# Patient Record
Sex: Male | Born: 1975
Health system: Southern US, Community
[De-identification: ages and names within clinical notes are randomized; demographics above are authoritative.]

## PROBLEM LIST (undated history)

## (undated) HISTORY — PX: HERNIA REPAIR: SHX51

---

## 2002-02-11 HISTORY — PX: PILONIDAL CYST / SINUS EXCISION: SUR543

## 2012-03-15 ENCOUNTER — Emergency Department (HOSPITAL_COMMUNITY)
Admission: EM | Admit: 2012-03-15 | Discharge: 2012-03-15 | Disposition: A | Discharge status: Home / Self Care | Payer: Self-pay | Attending: Emergency Medicine | Admitting: Emergency Medicine

## 2012-03-15 ENCOUNTER — Encounter (HOSPITAL_COMMUNITY): Payer: Self-pay

## 2012-03-15 ENCOUNTER — Emergency Department (HOSPITAL_COMMUNITY): Payer: Self-pay

## 2012-03-15 DIAGNOSIS — R1902 Left upper quadrant abdominal swelling, mass and lump: Secondary | ICD-10-CM | POA: Insufficient documentation

## 2012-03-15 DIAGNOSIS — R0989 Other specified symptoms and signs involving the circulatory and respiratory systems: Secondary | ICD-10-CM | POA: Insufficient documentation

## 2012-03-15 DIAGNOSIS — R109 Unspecified abdominal pain: Secondary | ICD-10-CM

## 2012-03-15 DIAGNOSIS — R0609 Other forms of dyspnea: Secondary | ICD-10-CM | POA: Insufficient documentation

## 2012-03-15 DIAGNOSIS — K429 Umbilical hernia without obstruction or gangrene: Secondary | ICD-10-CM | POA: Insufficient documentation

## 2012-03-15 DIAGNOSIS — R1033 Periumbilical pain: Secondary | ICD-10-CM | POA: Insufficient documentation

## 2012-03-15 LAB — CBC WITH DIFFERENTIAL/PLATELET
Basophils Absolute: 0 10*3/uL (ref 0.0–0.1)
HCT: 43 % (ref 39.0–52.0)
Lymphocytes Relative: 26 % (ref 12–46)
Neutro Abs: 5.2 10*3/uL (ref 1.7–7.7)
Platelets: 260 10*3/uL (ref 150–400)
RDW: 12.3 % (ref 11.5–15.5)
WBC: 7.9 10*3/uL (ref 4.0–10.5)

## 2012-03-15 LAB — COMPREHENSIVE METABOLIC PANEL
ALT: 16 U/L (ref 0–53)
AST: 16 U/L (ref 0–37)
CO2: 27 mEq/L (ref 19–32)
Chloride: 98 mEq/L (ref 96–112)
GFR calc non Af Amer: >90 mL/min (ref >90)
Potassium: 4 mEq/L (ref 3.5–5.1)
Sodium: 136 mEq/L (ref 135–145)
Total Bilirubin: 0.4 mg/dL (ref 0.3–1.2)

## 2012-03-15 LAB — URINALYSIS, ROUTINE W REFLEX MICROSCOPIC
Bilirubin Urine: NEGATIVE
Hgb urine dipstick: NEGATIVE
Protein, ur: NEGATIVE mg/dL
Urobilinogen, UA: 0.2 mg/dL (ref 0.0–1.0)

## 2012-03-15 MED ORDER — IOHEXOL 300 MG/ML  SOLN
100.0000 mL | Freq: Once | INTRAMUSCULAR | Status: AC | PRN
  Administered 2012-03-15: 100 mL via INTRAVENOUS

## 2012-03-15 MED ORDER — IOHEXOL 300 MG/ML  SOLN
50.0000 mL | Freq: Once | INTRAMUSCULAR | Status: AC | PRN
  Administered 2012-03-15: 50 mL via ORAL

## 2012-03-15 MED ORDER — OXYCODONE-ACETAMINOPHEN 5-325 MG PO TABS: 2.0000 | ORAL_TABLET | ORAL | Status: DC | PRN

## 2012-03-15 NOTE — ED Notes (Signed)
Patient reports that he has had mild mid abdominal pain x 3 months and has gotten progressively worse. Patient denies N/V/D. Patient states he has increased his intake Ibuprofen intake. Patient describes the pain as "sore".

## 2012-03-15 NOTE — ED Provider Notes (Signed)
History     CSN: [id number]  Arrival date & time 03/15/12  1403   First MD Initiated Contact with Patient 03/15/12 1505      Chief Complaint  Patient presents with  . Abdominal Pain    (Consider location/radiation/quality/duration/timing/severity/associated sxs/prior treatment) HPI Comments: Patient presents to ED c/o abdominal pain x 3 months that has been gradually worsening. States pain began as a non-radiating periumbilical ache rated a 2/10 and has since progressed to a primarily LUQ pain which he describes as throbbing and non-radiating rated an 8/10. When feeling his abdomen he also noticed a "bump" in his abdomen this morning which, in combination with his pain, brought him to the ED today. Patient has tried taking ibuprofen for the pain with mild to moderate relief. Pain is made worse with movement. Admits to having difficulty "catching his breath" when the pain is at its worst. He denies recent weight change, recent travel, fever, nausea, vomiting, diarrhea, chest pain, bruising, and rashes. Patient denies having a PCP. States never a smoker and does not drink alcohol.  Patient is a 37 y.o. male presenting with abdominal pain.  Abdominal Pain The primary symptoms of the illness include abdominal pain. The primary symptoms of the illness do not include fever, shortness of breath, nausea, vomiting, diarrhea or dysuria. The current episode started more than 2 days ago.  Symptoms associated with the illness do not include chills, constipation or hematuria.    History reviewed. No pertinent past medical history.  Past Surgical History  Procedure Date  . Cystectomy     No family history on file.  History  Substance Use Topics  . Smoking status: Never Smoker   . Smokeless tobacco: Never Used  . Alcohol Use: No      Review of Systems  Constitutional: Negative for fever, chills and unexpected weight change.  Respiratory: Negative for shortness of breath and wheezing.    Cardiovascular: Negative for chest pain.  Gastrointestinal: Positive for abdominal pain. Negative for nausea, vomiting, diarrhea, constipation and blood in stool.       States he noticed a bump in his LUQ this morning  Genitourinary: Negative for dysuria and hematuria.  Skin: Negative for color change and rash.  Neurological: Negative for dizziness and light-headedness.  Hematological: Does not bruise/bleed easily.    Allergies  Review of patient's allergies indicates no known allergies.  Home Medications   Current Outpatient Rx  Name  Route  Sig  Dispense  Refill  . OMEGA-3 FATTY ACIDS 1000 MG PO CAPS   Oral   Take 2 g by mouth daily.         . IBUPROFEN 200 MG PO TABS   Oral   Take 200 mg by mouth every 6 (six) hours as needed. Pain         . ADULT MULTIVITAMIN W/MINERALS CH   Oral   Take 1 tablet by mouth daily.           BP 113/67  Pulse 74  Temp 98.7 F (37.1 C) (Oral)  Resp 16  SpO2 98%  Physical Exam  Constitutional: He appears well-developed and well-nourished. No distress.  HENT:  Head: Normocephalic and atraumatic.  Eyes: Conjunctivae normal are normal. No scleral icterus.  Cardiovascular: Normal rate, regular rhythm and normal heart sounds.   Pulmonary/Chest: Breath sounds normal. No respiratory distress. He has no wheezes.  Abdominal: Soft. Bowel sounds are normal. He exhibits no distension. There is tenderness. There is no guarding.  Musculoskeletal: Normal  range of motion.  Neurological: He is alert.  Skin: No rash noted. He is not diaphoretic. No erythema. No pallor.  Psychiatric: He has a normal mood and affect. His behavior is normal.    ED Course  Procedures (including critical care time)  Labs Reviewed  URINALYSIS, ROUTINE W REFLEX MICROSCOPIC - Abnormal; Notable for the following:    APPearance CLOUDY (*)     All other components within normal limits  CBC WITH DIFFERENTIAL  COMPREHENSIVE METABOLIC PANEL  LIPASE, BLOOD   No  results found.   No diagnosis found.    MDM  Patient presents to ED for abdominal pain x 3 months that patient states has been getting worse. This morning he states he noticed a "bump" in his LUQ where he has been having tenderness which brought him into the ED today. CBC, CMP, lipase, and UA ordered which were all unremarkable. Will proceed with CT abd/pelvis with contrast given duration of symptoms. Patient states he does not need anything for pain at this time.  Patient still awaiting CT scan. Have discussed patient with Teressa Lower, FNP who will continue management of patient. Signed out to [place] at end of shift.  Filed Vitals:   03/15/12 1456 03/15/12 1744  BP: 134/94 113/67  Pulse: 95 74  Temp: 98.7 F (37.1 C) 98.7 F (37.1 C)  TempSrc: Oral Oral  Resp: 18 16  SpO2: 99% 98%          Antony Madura, PA-C 03/15/12 1809

## 2012-03-15 NOTE — ED Provider Notes (Signed)
Medical screening examination/treatment/procedure(s) were performed by non-physician practitioner and as supervising physician I was immediately available for consultation/collaboration.  Gilda Crease, MD 03/15/12 [phone]

## 2012-03-15 NOTE — ED Notes (Signed)
Pt verbalized understanding of d/c instructions and need to follow up with surgeon. Pt ambulatory to d/c window

## 2012-03-15 NOTE — ED Notes (Signed)
Discussed findings with pt:pt is okay to follow up as needed for worsening symptoms:pt denies any injury:pt referred to [facility] as he would like to discuss options regarding hernia  Teressa Lower, NP 03/15/12 2010

## 2012-03-20 ENCOUNTER — Ambulatory Visit (INDEPENDENT_AMBULATORY_CARE_PROVIDER_SITE_OTHER): Payer: Self-pay | Admitting: General Surgery

## 2012-03-20 ENCOUNTER — Encounter (INDEPENDENT_AMBULATORY_CARE_PROVIDER_SITE_OTHER): Payer: Self-pay | Admitting: General Surgery

## 2012-03-20 VITALS — BP 108/70 | HR 72 | Temp 98.4°F | Resp 14 | Ht 68.0 in | Wt 168.0 lb

## 2012-03-20 DIAGNOSIS — K439 Ventral hernia without obstruction or gangrene: Secondary | ICD-10-CM

## 2012-03-20 DIAGNOSIS — K429 Umbilical hernia without obstruction or gangrene: Secondary | ICD-10-CM | POA: Insufficient documentation

## 2012-03-20 NOTE — Progress Notes (Signed)
Patient ID: Elijah Hays, male   DOB: October 21, 1975, 37 y.o.   MRN: [id number]  Chief Complaint  Patient presents with  . Umbilical Hernia    HPI Elijah Hays is a 37 y.o. male.   HPI 37 yo male referred by Antony Madura, PA-C for evaluation of a umbilical hernia. Patient states he has had some mild periumbilical discomfort since November 2013. He states that he got a little bit worse in December but gradually worsened throughout January especially after having a case of the flu. He denied any nausea or vomiting. He denies any diarrhea or constipation. 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 However he is not able to work currently. He is going to school during the daytime to get his masters in education. Last Sunday he noticed a lump in his left upper quadrant which is very tender and uncomfortable. This prompted him to go to the emergency room where he was evaluated. He had labs as well as a CT scan. The CT scan only revealed a small umbilical hernia as well as some stranding in the left mid abdomen on top of the rectus muscle. He denies any trauma to that area. He states that it has gotten better since the weekend. He denies any weight loss. He has been taking ibuprofen for the discomfort. He states that he still pretty tender and sensitive to touch around his umbilicus. He states it is interfering with his daily activities. No past medical history on file.  Past Surgical History  Procedure Date  . Pilonidal cyst / sinus excision 2004    No family history on file.  Social History History  Substance Use Topics  . Smoking status: Never Smoker   . Smokeless tobacco: Never Used  . Alcohol Use: No    No Known Allergies  Current Outpatient Prescriptions  Medication Sig Dispense Refill  . fish oil-omega-3 fatty acids 1000 MG capsule Take 2 g by mouth daily.      Marland Kitchen ibuprofen (ADVIL,MOTRIN) 200 MG tablet Take 200 mg by mouth  every 6 (six) hours as needed. Pain      . Multiple Vitamin (MULTIVITAMIN WITH MINERALS) TABS Take 1 tablet by mouth daily.        Review of Systems Review of Systems  Constitutional: Negative for fever, chills, appetite change and unexpected weight change.  HENT: Negative for congestion and trouble swallowing.   Eyes: Negative for visual disturbance.  Respiratory: Negative for cough, chest tightness and shortness of breath.   Cardiovascular: Negative for chest pain and leg swelling.       No PND, no orthopnea, no DOE  Gastrointestinal:       See HPI  Genitourinary: Negative for dysuria and hematuria.  Musculoskeletal: Negative.   Skin: Negative for rash.       Former pilonidal cyst problem  Neurological: Negative for seizures and speech difficulty.  Hematological: Does not bruise/bleed easily.  Psychiatric/Behavioral: Negative for behavioral problems and confusion.    Blood pressure 108/70, pulse 72, temperature 98.4 F (36.9 C), temperature source Temporal, resp. rate 14, height 5\' 8"  (1.727 m), weight 168 lb (76.204 kg).  Physical Exam Physical Exam  Vitals reviewed. Constitutional: He is oriented to person, place, and time. He appears well-developed and well-nourished. No distress.  HENT:  Head: Normocephalic and atraumatic.  Right Ear: External ear normal.  Left Ear: External ear normal.  Eyes: Conjunctivae normal are normal. No scleral icterus.  Neck:  Normal range of motion. Neck supple. No tracheal deviation present. No thyromegaly present.  Cardiovascular: Normal rate, regular rhythm and normal heart sounds.   Pulmonary/Chest: Effort normal and breath sounds normal. No respiratory distress. He has no wheezes.  Abdominal: Soft. Bowel sounds are normal. He exhibits no distension. There is tenderness. There is no rebound and no guarding.         Palpable 2cm bulge in epigastric, soft, nonreducible. Defect feels about 1 cm in size. Has a vague subcu-soft tissue induration  over left rectus muscle in mid left abdomen which is soft, nontender. Has mild TTP around umbilicus. Small fascial defect about 1 cm  Musculoskeletal: Normal range of motion.  Lymphadenopathy:    He has no cervical adenopathy.  Neurological: He is alert and oriented to person, place, and time.  Skin: Skin is warm and dry. No rash noted. He is not diaphoretic. No erythema. No pallor.  Psychiatric: He has a normal mood and affect. His behavior is normal. Judgment and thought content normal.    Data Reviewed ED note 03/15/12 Labs cmet, cbc normal CT abd - pelvis Technique: Multidetector CT imaging of the abdomen and pelvis was  performed following the standard protocol during bolus  administration of intravenous contrast.  Contrast: OMNIPAQUE IOHEXOL 300 MG/ML SOLN  Comparison: None.  Findings: Lung Bases: Mild dependent atelectasis.  Liver: Normal.  Spleen: Normal.  Gallbladder: Normal.  Common bile duct: Normal.  Pancreas: Normal.  Adrenal glands: Normal.  Kidneys: Normal enhancement. Both ureters appear within normal  limits.  Stomach: Stomach appears within normal limits.  Small bowel: Normal.  Colon: Normal appendix. No inflammatory changes of the colon.  Pelvic Genitourinary: Normal. Urinary bladder normal. No free  fluid. No pelvic adenopathy.  Bones: Normal.  Vasculature: Normal.  There is straightening in the left periumbilical abdomen, including  in the left rectus sheath. The appearance suggests prior surgery  and scar tissue. Clinical correlation recommended.  Tiny fat containing periumbilical ventral hernia.   IMPRESSION:  1. No acute abdominal abnormality.  2. Stranding in the left periumbilical abdominal wall,  nonspecific. Clinically correlate with surgical history.  Potentially this could be post-traumatic.  There is also a small epigastric hernia containing fat on CT but not officially read Assessment    Umbilical hernia Epigastric hernia     Plan    He does have a small umbilical hernia as well as a larger epigastric hernia. I reviewed his CT scan with him. I'm not sure as to the etiology of the inflammation or stranding in his left abdominal wall. However it appears to be getting better and probably of little clinical significance.   We discussed the etiology of epigastric and umbilical hernias. We discussed the signs and symptoms of incarceration and strangulation. The patient was given educational material. I also drew diagrams.  We discussed nonoperative and operative management. With respect to operative management, we discussed both open repair and laparoscopic repair. We discussed the pros and cons of each approach. I discussed the typical aftercare with each procedure and how each procedure differs. Since there is a large space of normal fascia between his epigastric and umbilical hernia I recommended an open approach. If we did a laparoscopic approach I think he would have more postoperative pain as well as the need for large piece of mesh to cover both defects. Moreover given his young age, I believe he would be best served by reapproximating his muscle with possible mesh underlay by an open approach  The patient has elected to have an open repair of both of his umbilical and epigastric hernias with possible mesh Through 2 incisions  We discussed the risk and benefits of surgery including but not limited to bleeding, infection, injury to surrounding structures, hernia recurrence, mesh complications, hematoma/seroma formation,  blood clot formation, urinary retention, post operative ileus, general anesthesia risk, long-term abdominal pain. We discussed that this procedure can be  uncomfortable to recover from. We discussed the importance of avoiding heavy lifting and straining for a period of 6 weeks.  Mary Sella. Andrey Campanile, MD, FACS General, Bariatric, & Minimally Invasive Surgery [facility], [place]          [facility] M 03/20/2012, 1:52 PM

## 2012-03-20 NOTE — Patient Instructions (Signed)
Hernia, Surgical Repair °A hernia occurs when an internal organ pushes out through a weak spot in the belly (abdominal) wall muscles. Hernias commonly occur in the groin and around the navel. Hernias often can be pushed back into place (reduced). Most hernias tend to get worse over time. Problems occur when abdominal contents get stuck in the opening (incarcerated hernia). The blood supply gets cut off (strangulated hernia). This is an emergency and needs surgery. Otherwise, hernia repair can be an elective procedure. This means you can schedule this at your convenience when an emergency is not present. Because complications can occur, if you decide to repair the hernia, it is best to do it soon. When it becomes an emergency procedure, there is increased risk of complications after surgery. °CAUSES  °· Heavy lifting. °· Obesity. °· Prolonged coughing. °· Straining to move your bowels. °· Hernias can also occur through a cut (incision) by a surgeonafter an abdominal operation. °HOME CARE INSTRUCTIONS °Before the repair: °· Bed rest is not required. You may continue your normal activities, but avoid heavy lifting (more than 10 pounds) or straining. Cough gently. If you are a smoker, it is best to stop. Even the best hernia repair can break down with the continual strain of coughing. °· Do not wear anything tight over your hernia. Do not try to keep it in with an outside bandage or truss. These can damage abdominal contents if they are trapped in the hernia sac. °· Eat a normal diet. Avoid constipation. Straining over long periods of time to have a bowel movement will increase hernia size. It also can breakdown repairs. If you cannot do this with diet alone, laxatives or stool softeners may be used. °PRIOR TO SURGERY, SEEK IMMEDIATE MEDICAL CARE IF: °You have problems (symptoms) of a trapped (incarcerated) hernia. Symptoms include: °· An oral temperature above 102° F (38.9° C) develops, or as your caregiver  suggests. °· Increasing abdominal pain. °· Feeling sick to your stomach(nausea) and vomiting. °· You stop passing gas or stool. °· The hernia is stuck outside the abdomen, looks discolored, feels hard, or is tender. °· You have any changes in your bowel habits or in the hernia that is unusual for you. °LET YOUR CAREGIVERS KNOW ABOUT THE FOLLOWING: °· Allergies. °· Medications taken including herbs, eye drops, over the counter medications, and creams. °· Use of steroids (by mouth or creams). °· Family or personal history of problems with anesthetics or Novocaine. °· Possibility of pregnancy, if this applies. °· Personal history of blood clots (thrombophlebitis). °· Family or personal history of bleeding or blood problems. °· Previous surgery. °· Other health problems. °BEFORE THE PROCEDURE °You should be present 1 hour prior to your procedure, or as directed by your caregiver.  °AFTER THE PROCEDURE °After surgery, you will be taken to the recovery area. A nurse will watch and check your progress there. Once you are awake, stable, and taking fluids well, you will be allowed to go home as long as there are no problems. Once home, an ice pack (wrapped in a light towel) applied to your operative site may help with discomfort. It may also keep the swelling down. Do not lift anything heavier than 10 pounds (4.55 kilograms). Take showers not baths. Do not drive while taking narcotics. Follow instructions as suggested by your caregiver.  °SEEK IMMEDIATE MEDICAL CARE IF: °After surgery: °· There is redness, swelling, or increasing pain in the wound. °· There is pus coming from the wound. °· There is   drainage from a wound lasting longer than 1 day. °· An unexplained oral temperature above 102° F (38.9° C) develops. °· You notice a foul smell coming from the wound or dressing. °· There is a breaking open of a wound (edged not staying together) after the sutures have been removed. °· You notice increasing pain in the shoulders  (shoulder strap areas). °· You develop dizzy episodes or fainting while standing. °· You develop persistent nausea or vomiting. °· You develop a rash. °· You have difficulty breathing. °· You develop any reaction or side effects to medications given. °MAKE SURE YOU:  °· Understand these instructions. °· Will watch your condition. °· Will get help right away if you are not doing well or get worse. °Document Released: 07/24/2000 Document Revised: 04/22/2011 Document Reviewed: 06/16/2007 °Exit[employer]® Patient Information ©2013 [employer]. ° °

## 2012-03-31 DIAGNOSIS — K439 Ventral hernia without obstruction or gangrene: Secondary | ICD-10-CM

## 2012-03-31 DIAGNOSIS — K429 Umbilical hernia without obstruction or gangrene: Secondary | ICD-10-CM

## 2012-04-24 ENCOUNTER — Encounter (INDEPENDENT_AMBULATORY_CARE_PROVIDER_SITE_OTHER): Payer: Self-pay | Admitting: General Surgery

## 2012-04-24 ENCOUNTER — Ambulatory Visit (INDEPENDENT_AMBULATORY_CARE_PROVIDER_SITE_OTHER): Payer: Self-pay | Admitting: General Surgery

## 2012-04-24 VITALS — BP 112/64 | HR 64 | Temp 97.5°F | Resp 16 | Ht 68.5 in | Wt 172.0 lb

## 2012-04-24 DIAGNOSIS — Z09 Encounter for follow-up examination after completed treatment for conditions other than malignant neoplasm: Secondary | ICD-10-CM

## 2012-04-24 NOTE — Progress Notes (Signed)
Subjective:     Patient ID: Elijah Hays, male   DOB: [birth year], 37 y.o.   MRN: [id number]  HPI  37 year old Hispanic male comes in for followup after undergoing open primary repair of an umbilical hernia as well as open primary repair of an epigastric hernia on February 18. He states that he did quite well after surgery. He denies any fevers or chills. He denies any nausea or vomiting. He denies any diarrhea or constipation. He denies any redness or drainage from his incisions. Review of Systems     Objective:   Physical Exam BP 112/64  Pulse 64  Temp(Src) 97.5 F (36.4 C) (Temporal)  Resp 16  Ht 5' 8.5" (1.74 m)  Wt 172 lb (78.019 kg)  BMI 25.77 kg/m2  Alert, no apparent distress Abdomen-soft, nontender, nondistended. Well-healed epigastric and infraumbilical incisions. No cellulitis, induration or fluctuance. No sign of hernia recurrence.    Assessment:     Status post open primary repair of an umbilical hernia as well as open repair of an epigastric hernia    Plan:     Overall he is doing quite well. I am very pleased with his progress. He was reminded he should not do any strenuous activities or heavy lifting for another 2 weeks. I then encouraged him to gradually ease back into full activities. Followup as needed  Mary Sella. Andrey Campanile, MD, FACS General, Bariatric, & Minimally Invasive Surgery [facility], [place]

## 2012-04-24 NOTE — Patient Instructions (Signed)
Can resume full activities in 2 weeks 

## 2014-03-10 ENCOUNTER — Emergency Department (HOSPITAL_COMMUNITY): Payer: Self-pay

## 2014-03-10 ENCOUNTER — Emergency Department (HOSPITAL_COMMUNITY): Payer: Self-pay | Admitting: Registered Nurse

## 2014-03-10 ENCOUNTER — Encounter (HOSPITAL_COMMUNITY): Payer: Self-pay | Admitting: Emergency Medicine

## 2014-03-10 ENCOUNTER — Encounter (HOSPITAL_COMMUNITY)
Admission: EM | Disposition: A | Discharge status: Home / Self Care | Payer: Self-pay | Source: Home / Self Care | Attending: Emergency Medicine

## 2014-03-10 ENCOUNTER — Ambulatory Visit (HOSPITAL_COMMUNITY)
Admission: EM | Admit: 2014-03-10 | Discharge: 2014-03-10 | DRG: 395 | Disposition: A | Discharge status: Home / Self Care | Payer: Self-pay | Attending: Emergency Medicine | Admitting: Emergency Medicine

## 2014-03-10 ENCOUNTER — Ambulatory Visit: Admit: 2014-03-10 | Payer: Self-pay | Admitting: Surgery

## 2014-03-10 DIAGNOSIS — T185XXA Foreign body in anus and rectum, initial encounter: Secondary | ICD-10-CM | POA: Diagnosis present

## 2014-03-10 HISTORY — PX: RECTAL EXAM UNDER ANESTHESIA: SHX6399

## 2014-03-10 SURGERY — EXAM UNDER ANESTHESIA, RECTUM
Anesthesia: General | Site: Rectum

## 2014-03-10 MED ORDER — LACTATED RINGERS IV SOLN
INTRAVENOUS | Status: DC | PRN
  Administered 2014-03-10: 20:00:00 via INTRAVENOUS

## 2014-03-10 MED ORDER — ONDANSETRON HCL 4 MG/2ML IJ SOLN
INTRAMUSCULAR | Status: DC | PRN
  Administered 2014-03-10: 4 mg via INTRAVENOUS

## 2014-03-10 MED ORDER — FENTANYL CITRATE 0.05 MG/ML IJ SOLN
INTRAMUSCULAR | Status: DC | PRN
  Administered 2014-03-10: 50 ug via INTRAVENOUS

## 2014-03-10 MED ORDER — PROPOFOL 10 MG/ML IV BOLUS
INTRAVENOUS | Status: AC
  Filled 2014-03-10: qty 20

## 2014-03-10 MED ORDER — 0.9 % SODIUM CHLORIDE (POUR BTL) OPTIME
TOPICAL | Status: DC | PRN
  Administered 2014-03-10: 2000 mL

## 2014-03-10 MED ORDER — GLYCOPYRROLATE 0.2 MG/ML IJ SOLN
INTRAMUSCULAR | Status: AC
  Filled 2014-03-10: qty 3

## 2014-03-10 MED ORDER — OXYCODONE HCL 5 MG/5ML PO SOLN
5.0000 mg | Freq: Once | ORAL | Status: DC | PRN
  Filled 2014-03-10: qty 5

## 2014-03-10 MED ORDER — SUCCINYLCHOLINE CHLORIDE 20 MG/ML IJ SOLN
INTRAMUSCULAR | Status: DC | PRN
  Administered 2014-03-10: 100 mg via INTRAVENOUS

## 2014-03-10 MED ORDER — KETOROLAC TROMETHAMINE 30 MG/ML IJ SOLN: 30.0000 mg | Freq: Once | INTRAMUSCULAR | Status: DC | PRN

## 2014-03-10 MED ORDER — MIDAZOLAM HCL 5 MG/5ML IJ SOLN
INTRAMUSCULAR | Status: DC | PRN
  Administered 2014-03-10: 2 mg via INTRAVENOUS

## 2014-03-10 MED ORDER — PROMETHAZINE HCL 25 MG/ML IJ SOLN: 6.2500 mg | INTRAMUSCULAR | Status: DC | PRN

## 2014-03-10 MED ORDER — FENTANYL CITRATE 0.05 MG/ML IJ SOLN: 25.0000 ug | INTRAMUSCULAR | Status: DC | PRN

## 2014-03-10 MED ORDER — ROCURONIUM BROMIDE 100 MG/10ML IV SOLN
INTRAVENOUS | Status: DC | PRN
  Administered 2014-03-10: 20 mg via INTRAVENOUS

## 2014-03-10 MED ORDER — GLYCOPYRROLATE 0.2 MG/ML IJ SOLN
INTRAMUSCULAR | Status: DC | PRN
  Administered 2014-03-10: .6 mg via INTRAVENOUS

## 2014-03-10 MED ORDER — NEOSTIGMINE METHYLSULFATE 10 MG/10ML IV SOLN
INTRAVENOUS | Status: AC
  Filled 2014-03-10: qty 1

## 2014-03-10 MED ORDER — ONDANSETRON HCL 4 MG/2ML IJ SOLN
INTRAMUSCULAR | Status: AC
  Filled 2014-03-10: qty 2

## 2014-03-10 MED ORDER — MIDAZOLAM HCL 2 MG/2ML IJ SOLN
INTRAMUSCULAR | Status: AC
  Filled 2014-03-10: qty 2

## 2014-03-10 MED ORDER — NEOSTIGMINE METHYLSULFATE 10 MG/10ML IV SOLN
INTRAVENOUS | Status: DC | PRN
  Administered 2014-03-10: 4 mg via INTRAVENOUS

## 2014-03-10 MED ORDER — FENTANYL CITRATE 0.05 MG/ML IJ SOLN
INTRAMUSCULAR | Status: AC
  Filled 2014-03-10: qty 5

## 2014-03-10 MED ORDER — PROPOFOL 10 MG/ML IV BOLUS
INTRAVENOUS | Status: DC | PRN
  Administered 2014-03-10: 200 mg via INTRAVENOUS

## 2014-03-10 MED ORDER — LIDOCAINE HCL (CARDIAC) 20 MG/ML IV SOLN
INTRAVENOUS | Status: DC | PRN
  Administered 2014-03-10: 100 mg via INTRAVENOUS

## 2014-03-10 MED ORDER — LIDOCAINE HCL (CARDIAC) 20 MG/ML IV SOLN
INTRAVENOUS | Status: AC
  Filled 2014-03-10: qty 5

## 2014-03-10 MED ORDER — OXYCODONE HCL 5 MG PO TABS: 5.0000 mg | ORAL_TABLET | Freq: Once | ORAL | Status: DC | PRN

## 2014-03-10 SURGICAL SUPPLY — 41 items
BLADE HEX COATED 2.75 (ELECTRODE) IMPLANT
BLADE SURG 15 STRL LF DISP TIS (BLADE) IMPLANT
BLADE SURG 15 STRL SS (BLADE)
CANNULA VESSEL W/WING WO/VALVE (CANNULA) IMPLANT
DECANTER SPIKE VIAL GLASS SM (MISCELLANEOUS) IMPLANT
DRAPE SHEET LG 3/4 BI-LAMINATE (DRAPES) ×3 IMPLANT
DRSG PAD ABDOMINAL 8X10 ST (GAUZE/BANDAGES/DRESSINGS) IMPLANT
ELECT REM PT RETURN 9FT ADLT (ELECTROSURGICAL) ×3
ELECTRODE REM PT RTRN 9FT ADLT (ELECTROSURGICAL) ×1 IMPLANT
GAUZE SPONGE 4X4 12PLY STRL (GAUZE/BANDAGES/DRESSINGS) ×3 IMPLANT
GAUZE SPONGE 4X4 16PLY XRAY LF (GAUZE/BANDAGES/DRESSINGS) IMPLANT
GLOVE BIOGEL PI IND STRL 7.0 (GLOVE) ×1 IMPLANT
GLOVE BIOGEL PI INDICATOR 7.0 (GLOVE) ×2
GLOVE SURG SIGNA 7.5 PF LTX (GLOVE) ×3 IMPLANT
GOWN SPEC L4 XLG W/TWL (GOWN DISPOSABLE) ×3 IMPLANT
GOWN STRL REUS W/ TWL XL LVL3 (GOWN DISPOSABLE) IMPLANT
GOWN STRL REUS W/TWL LRG LVL3 (GOWN DISPOSABLE) ×3 IMPLANT
GOWN STRL REUS W/TWL XL LVL3 (GOWN DISPOSABLE)
KIT BASIN OR (CUSTOM PROCEDURE TRAY) ×3 IMPLANT
LUBRICANT JELLY K Y 4OZ (MISCELLANEOUS) ×3 IMPLANT
NDL SAFETY ECLIPSE 18X1.5 (NEEDLE) IMPLANT
NEEDLE HYPO 18GX1.5 SHARP (NEEDLE)
NEEDLE HYPO 25X1 1.5 SAFETY (NEEDLE) IMPLANT
NS IRRIG 1000ML POUR BTL (IV SOLUTION) ×3 IMPLANT
PACK GENERAL/GYN (CUSTOM PROCEDURE TRAY) ×3 IMPLANT
PACK LITHOTOMY IV (CUSTOM PROCEDURE TRAY) IMPLANT
PENCIL BUTTON HOLSTER BLD 10FT (ELECTRODE) IMPLANT
SPONGE SURGIFOAM ABS GEL 100 (HEMOSTASIS) IMPLANT
SURGILUBE 3G PEEL PACK STRL (MISCELLANEOUS) IMPLANT
SUT CHROMIC 2 0 SH (SUTURE) IMPLANT
SUT CHROMIC 3 0 SH 27 (SUTURE) IMPLANT
SYR 50ML LL SCALE MARK (SYRINGE) IMPLANT
SYR BULB IRRIGATION 50ML (SYRINGE) IMPLANT
SYR CONTROL 10ML LL (SYRINGE) IMPLANT
TOWEL OR 17X26 10 PK STRL BLUE (TOWEL DISPOSABLE) ×3 IMPLANT
TRAP SPECIMEN MUCOUS 40CC (MISCELLANEOUS) IMPLANT
TUBING CONNECTING 10 (TUBING) IMPLANT
TUBING CONNECTING 10' (TUBING)
UNDERPAD 30X30 INCONTINENT (UNDERPADS AND DIAPERS) ×6 IMPLANT
WATER STERILE IRR 1500ML POUR (IV SOLUTION) IMPLANT
YANKAUER SUCT BULB TIP 10FT TU (MISCELLANEOUS) IMPLANT

## 2014-03-10 NOTE — ED Notes (Signed)
Pt states that he put a plastic device in his rectum several hours ago and cant get it out. Dr Thalia BloodgoodNeuman is being paged and aware pt is coming and will liely go straight to surgery.

## 2014-03-10 NOTE — Anesthesia Preprocedure Evaluation (Addendum)
Anesthesia Evaluation  Patient identified by MRN, date of birth, ID band Patient awake    Reviewed: Allergy & Precautions, NPO status , Patient's Chart, lab work & pertinent test results  Airway Mallampati: II  TM Distance: >3 FB Neck ROM: Full    Dental   Pulmonary neg pulmonary ROS,          Cardiovascular negative cardio ROS      Neuro/Psych negative neurological ROS     GI/Hepatic negative GI ROS, Neg liver ROS,   Endo/Other  negative endocrine ROS  Renal/GU negative Renal ROS     Musculoskeletal   Abdominal   Peds  Hematology negative hematology ROS (+)   Anesthesia Other Findings   Reproductive/Obstetrics                            Anesthesia Physical Anesthesia Plan  ASA: I  Anesthesia Plan: General   Post-op Pain Management:    Induction: Intravenous  Airway Management Planned: Oral ETT  Additional Equipment:   Intra-op Plan:   Post-operative Plan: Extubation in OR  Informed Consent: I have reviewed the patients History and Physical, chart, labs and discussed the procedure including the risks, benefits and alternatives for the proposed anesthesia with the patient or authorized representative who has indicated his/her understanding and acceptance.     Plan Discussed with: CRNA  Anesthesia Plan Comments:         Anesthesia Quick Evaluation

## 2014-03-10 NOTE — Op Note (Signed)
03/10/2014  8:42 PM  PATIENT:  Elijah Hays, 39 y.o., male, MRN: [id number]  PREOP DIAGNOSIS:  foreign body rectum  POSTOP DIAGNOSIS:   foreign body rectum, approximate 18 cm dildo (photo at end of note)  PROCEDURE:   Procedure(s): RECTAL EXAM UNDER ANESTHESIA, removal of foreign body from rectum  SURGEON:   Ovidio Kinavid Bravlio Luca, M.D.  ASSISTANT:   None  ANESTHESIA:   general  Anesthesiologist: Kennieth Radobert E Fitzgerald, MD CRNA: Elisabeth CaraLacey A Armistead, CRNA  General  EBL:  none  ml  LOCAL MEDICATIONS USED:   None  SPECIMEN:   Dildo - will be returned to patient  COUNTS CORRECT:  YES  INDICATIONS FOR PROCEDURE:  Elijah Hays is a 39 y.o. (DOB: 1975/07/07) white  male whose primary care physician is No primary care provider on file.Marland Kitchen.  He has inserted a dildo up his rectum.  He comes for removal of this rectum.   The indications and risks of the surgery were explained to the patient.  The risks include, but are not limited to, perforation, failure to remove object, and nerve injury.  Procedure Note:  [facility]    A time out was held and the surgical checklist run.  The patient was in lithotomy.  The patient was unprepped.  I could feel the tip of the foreign body with my index finger - about 7-9 cm up the rectum.  I was able to push the foreign body with my hand on the abdomen and I grabbed the foreign body with sponge forceps.  The foreign body was removed intact.  I had to dilate his rectum to get this out, but there was no other apparent injury.  I will discharge him home tonight with return to my office on a PRN basis.   Foreign body removed   Ovidio Kinavid Keana Dueitt, MD, [facility] Pager: [phone] Office phone:  [phone]

## 2014-03-10 NOTE — H&P (Signed)
Re:   Elijah Hays DOB:   1975/06/20 MRN:   [id number]   [facility] Admission  ASSESSMENT AND PLAN: 1.  Foreign body in rectum  Patient has dildo in rectum.  I discussed with him about the removal of this and the risks of bowel perforation and tearing rectum.  2.  History of umbilical hernia repair - 2014 - [facility]  Chief Complaint  Patient presents with  . Foreign Body in Rectum   REFERRING PHYSICIAN: No primary care provider on file.  HISTORY OF PRESENT ILLNESS: Elijah Hays is a 39 y.o. (DOB: 10/01/1975) white  male whose primary care physician is No primary care provider on file. and comes [facility] today for a foreign body in his rectum.  I was called by Daryel November, PA who works with Dr. Cleophas Dunker.  He called me about the patient loosing a dildo up his rectum.  I had him bring him to [facility]. He will need general anesthesia to remove this.  He has not prior history of rectal or colon disease.  His only abdominal surgery was an umbilical hernia by Dr. Andrey Campanile in 2004.   History reviewed. No pertinent past medical history.    Past Surgical History  Procedure Laterality Date  . Pilonidal cyst / sinus excision  2004  . Hernia repair        No current facility-administered medications for this encounter.   Current Outpatient Prescriptions  Medication Sig Dispense Refill  . Camphor-Eucalyptus-Menthol (VICKS VAPORUB EX) Apply 1 application topically daily as needed (cough ( applies to neck area)).    . Multiple Vitamins-Minerals (MULTI ADULT GUMMIES PO) Take 1 each by mouth daily.    Marland Kitchen oxymetazoline (AFRIN) 0.05 % nasal spray Place 1 spray into both nostrils daily as needed for congestion.    . pseudoephedrine-guaifenesin (MUCINEX D) 60-600 MG per tablet Take 1 tablet by mouth daily.       No Known Allergies  REVIEW OF SYSTEMS: Skin:  No history of rash.  No history of abnormal moles. Infection:  No history of hepatitis or HIV.  No history of MRSA. Neurologic:  No history  of stroke.  No history of seizure.  No history of headaches. Cardiac:  No history of hypertension. No history of heart disease.  No history of prior cardiac catheterization.  No history of seeing a cardiologist. Pulmonary:  He has had some recent sinus symptoms for which he has taken OTC meds.  Endocrine:  No diabetes. No thyroid disease. Gastrointestinal:  No history of stomach disease.  No history of liver disease.  No history of gall bladder disease.  No history of pancreas disease.  No history of colon disease. Urologic:  No history of kidney stones.  No history of bladder infections. Musculoskeletal:  No history of joint or back disease. Hematologic:  No bleeding disorder.  No history of anemia.  Not anticoagulated. Psycho-social:  The patient is oriented.   The patient has no obvious psychologic or social impairment to understanding our conversation and plan.  SOCIAL and FAMILY HISTORY: Single. Lives by self. Works as Publishing copy  PHYSICAL EXAM: BP 126/81 mmHg  Pulse 83  Temp(Src) 98.3 F (36.8 C) (Oral)  Resp 18  Ht  (1.727 m)  Wt 182 lb (82.555 kg)  BMI 27.68 kg/m2  SpO2 97%  General: WM who is alert and generally healthy appearing.  HEENT: Normal. Pupils equal. Neck: Supple. No mass.  No thyroid mass. Lymph Nodes:  No supraclavicular or cervical nodes.  Lungs: Clear to auscultation and symmetric breath sounds. Heart:  RRR. No murmur or rub. Abdomen: Soft. No mass. No tenderness. No hernia. Normal bowel sounds.   Rectal: Not done. Extremities:  Good strength and ROM  in upper and lower extremities. Neurologic:  Grossly intact to motor and sensory function. Psychiatric: Has normal mood and affect. Behavior is normal.   DATA REVIEWED: Epic notes  Ovidio Kinavid Sienna Stonehocker, MD,  [facility] [address]  [address]   [place], [place]    [id number] Phone:  [phone] FAX:  [phone]

## 2014-03-10 NOTE — Anesthesia Postprocedure Evaluation (Signed)
  Anesthesia Post-op Note  Patient: Elijah Hays  Procedure(s) Performed: Procedure(s): RECTAL EXAM UNDER ANESTHESIA, removal of foreign body from rectum (N/A)  Patient Location: PACU  Anesthesia Type:General  Level of Consciousness: awake and alert   Airway and Oxygen Therapy: Patient Spontanous Breathing  Post-op Pain: none  Post-op Assessment: Post-op Vital signs reviewed  Post-op Vital Signs: Reviewed  Last Vitals:  Filed Vitals:   03/10/14 2130  BP: 104/60  Pulse: 61  Temp: 36.9 C  Resp: 11    Complications: No apparent anesthesia complications

## 2014-03-10 NOTE — Discharge Instructions (Signed)
[facility] - DISCHARGE INSTRUCTIONS TO PATIENT  Activity:  Driving - May drive tomorrow if doing well  Diet:  As tolerated  Follow up appointment:  Call Dr. Allene PyoNewman's office [facility]) at [phone] for question.  No follow up necessary.   Call Dr. Ezzard StandingNewman or his office  [phone]) if you have:  Persistent nausea and vomiting,  Severe uncontrolled pain,  Any other questions or concerns you may have after discharge.  In an emergency, call 911 or go to an Emergency Department at a nearby hospital.    Post Anesthesia Home Care Instructions  Activity: Get plenty of rest for the remainder of the day. A responsible adult should stay with you for 24 hours following the procedure.  For the next 24 hours, DO NOT: -Drive a car -Advertising copywriterperate machinery -Drink alcoholic beverages -Take any medication unless instructed by your physician -Make any legal decisions or sign important papers.  Meals: Start with liquid foods such as gelatin or soup. Progress to regular foods as tolerated. Avoid greasy, spicy, heavy foods. If nausea and/or vomiting occur, drink only clear liquids until the nausea and/or vomiting subsides. Call your physician if vomiting continues.  Special Instructions/Symptoms: Your throat may feel dry or sore from the anesthesia or the breathing tube placed in your throat during surgery. If this causes discomfort, gargle with warm salt water. The discomfort should disappear within 24 hours.

## 2014-03-10 NOTE — Transfer of Care (Signed)
Immediate Anesthesia Transfer of Care Note  Patient: Elijah Hays  Procedure(s) Performed: Procedure(s): RECTAL EXAM UNDER ANESTHESIA, removal of foreign body from rectum (N/A)  Patient Location: PACU  Anesthesia Type:General  Level of Consciousness: awake, alert , oriented and patient cooperative  Airway & Oxygen Therapy: Patient Spontanous Breathing and Patient connected to face mask oxygen  Post-op Assessment: Report given to RN, Post -op Vital signs reviewed and stable and Patient moving all extremities  Post vital signs: Reviewed and stable  Last Vitals:  Filed Vitals:   03/10/14 1843  BP: 126/81  Pulse: 83  Temp: 36.8 C  Resp: 18    Complications: No apparent anesthesia complications

## 2014-03-10 NOTE — ED Provider Notes (Signed)
CSN: [id number]     Arrival date & time 03/10/14  1838 History   First MD Initiated Contact with Patient 03/10/14 1904     Chief Complaint  Patient presents with  . Foreign Body in Rectum     (Consider location/radiation/quality/duration/timing/severity/associated sxs/prior Treatment) HPI Comments: Pt was using a vibrator and it got stuck in his rectum  Patient is a 39 y.o. male presenting with foreign body in rectum. The history is provided by the patient.  Foreign Body in Rectum This is a new problem. The current episode started 1 to 2 hours ago. The problem occurs constantly. The problem has not changed since onset.Pertinent negatives include no abdominal pain. Nothing aggravates the symptoms. Nothing relieves the symptoms. He has tried nothing for the symptoms. The treatment provided no relief.    History reviewed. No pertinent past medical history. Past Surgical History  Procedure Laterality Date  . Pilonidal cyst / sinus excision  2004  . Hernia repair     No family history on file. History  Substance Use Topics  . Smoking status: Never Smoker   . Smokeless tobacco: Never Used  . Alcohol Use: No    Review of Systems  Gastrointestinal: Negative for abdominal pain.  All other systems reviewed and are negative.     Allergies  Review of patient's allergies indicates no known allergies.  Home Medications   Prior to Admission medications   Medication Sig Start Date End Date Taking? Authorizing Provider  Camphor-Eucalyptus-Menthol (VICKS VAPORUB EX) Apply 1 application topically daily as needed (cough ( applies to neck area)).   Yes Historical Provider, MD  Multiple Vitamins-Minerals (MULTI ADULT GUMMIES PO) Take 1 each by mouth daily.   Yes Historical Provider, MD  oxymetazoline (AFRIN) 0.05 % nasal spray Place 1 spray into both nostrils daily as needed for congestion.   Yes Historical Provider, MD  pseudoephedrine-guaifenesin (MUCINEX D) 60-600 MG per tablet Take 1  tablet by mouth daily.   Yes Historical Provider, MD   BP 126/81 mmHg  Pulse 83  Temp(Src) 98.3 F (36.8 C) (Oral)  Resp 18  Ht 5\' 8"  (1.727 m)  Wt 182 lb (82.555 kg)  BMI 27.68 kg/m2  SpO2 97% Physical Exam  Constitutional: He is oriented to person, place, and time. He appears well-developed and well-nourished. No distress.  HENT:  Head: Normocephalic and atraumatic.  Eyes: EOM are normal. Pupils are equal, round, and reactive to light.  Cardiovascular: Normal rate.   Pulmonary/Chest: Effort normal.  Abdominal: Soft. Bowel sounds are normal. He exhibits no distension. There is no tenderness. There is no rebound and no guarding.  Genitourinary: Rectum normal.  Musculoskeletal: Normal range of motion. He exhibits no edema or tenderness.  Neurological: He is alert and oriented to person, place, and time.  Skin: Skin is warm and dry.  Psychiatric: He has a normal mood and affect. His behavior is normal.  Nursing note and vitals reviewed.   ED Course  Procedures (including critical care time) Labs Review Labs Reviewed - No data to display  Imaging Review Dg Abd 1 View  03/10/2014   CLINICAL DATA:  Foreign body (dildo) placed in rectum, unable to retrieve. Initial encounter.  EXAM: ABDOMEN - 1 VIEW  COMPARISON:  Abdominal pelvic CT 03/15/2012.  FINDINGS: Metallic foreign bodies overlie the lower lumbar spine and sacrum, consistent with a foreign body within the rectosigmoid colon. The radiopaque components of this measure up to 12.9 cm in length. There is moderate stool throughout the colon. No  bowel wall thickening or supine evidence of free intraperitoneal air identified. The bones appear normal.  IMPRESSION: Presacral foreign body consistent with position in the rectosigmoid colon. If this cannot be retrieved manually or if there is concern of perforation, further evaluation with CT should be considered.   Electronically Signed   By: Roxy Horseman M.D.   On: 03/10/2014 19:22     EKG  Interpretation None      MDM   Final diagnoses:  Foreign body of rectum, initial encounter    Patient here with a retained vibrator in his rectum. He is currently having no abdominal pain, nausea or vomiting. It is present for approximately 2 hours. Dr. Ezzard Standing with surgery contacted for further treatment    Gwyneth Sprout, MD 03/10/14 1931

## 2014-03-10 NOTE — Anesthesia Procedure Notes (Signed)
Procedure Name: Intubation Date/Time: 03/10/2014 8:16 PM Performed by: Jarvis NewcomerARMISTEAD, Cephas Revard A Pre-anesthesia Checklist: Patient identified, Timeout performed, Emergency Drugs available, Suction available and Patient being monitored Patient Re-evaluated:Patient Re-evaluated prior to inductionOxygen Delivery Method: Circle system utilized Preoxygenation: Pre-oxygenation with 100% oxygen Intubation Type: IV induction and Rapid sequence Laryngoscope Size: Mac and 4 Grade View: Grade I Tube type: Oral Tube size: 7.5 mm Number of attempts: 1 Airway Equipment and Method: Stylet Placement Confirmation: ETT inserted through vocal cords under direct vision,  breath sounds checked- equal and bilateral and positive ETCO2 Secured at: 22 cm Tube secured with: Tape Dental Injury: Teeth and Oropharynx as per pre-operative assessment

## 2014-03-11 ENCOUNTER — Encounter (HOSPITAL_COMMUNITY): Payer: Self-pay | Admitting: Surgery

## 2016-04-13 IMAGING — CR DG ABDOMEN 1V
2 series · 2 of 2 positions shown · non-contrast
Comparison: Abdominal pelvic CT 03/15/2012.

CLINICAL DATA: Foreign body (dildo) placed in rectum, unable to
retrieve. Initial encounter.

EXAM:
ABDOMEN - 1 VIEW

[t abdomen supine (1 of 2)]
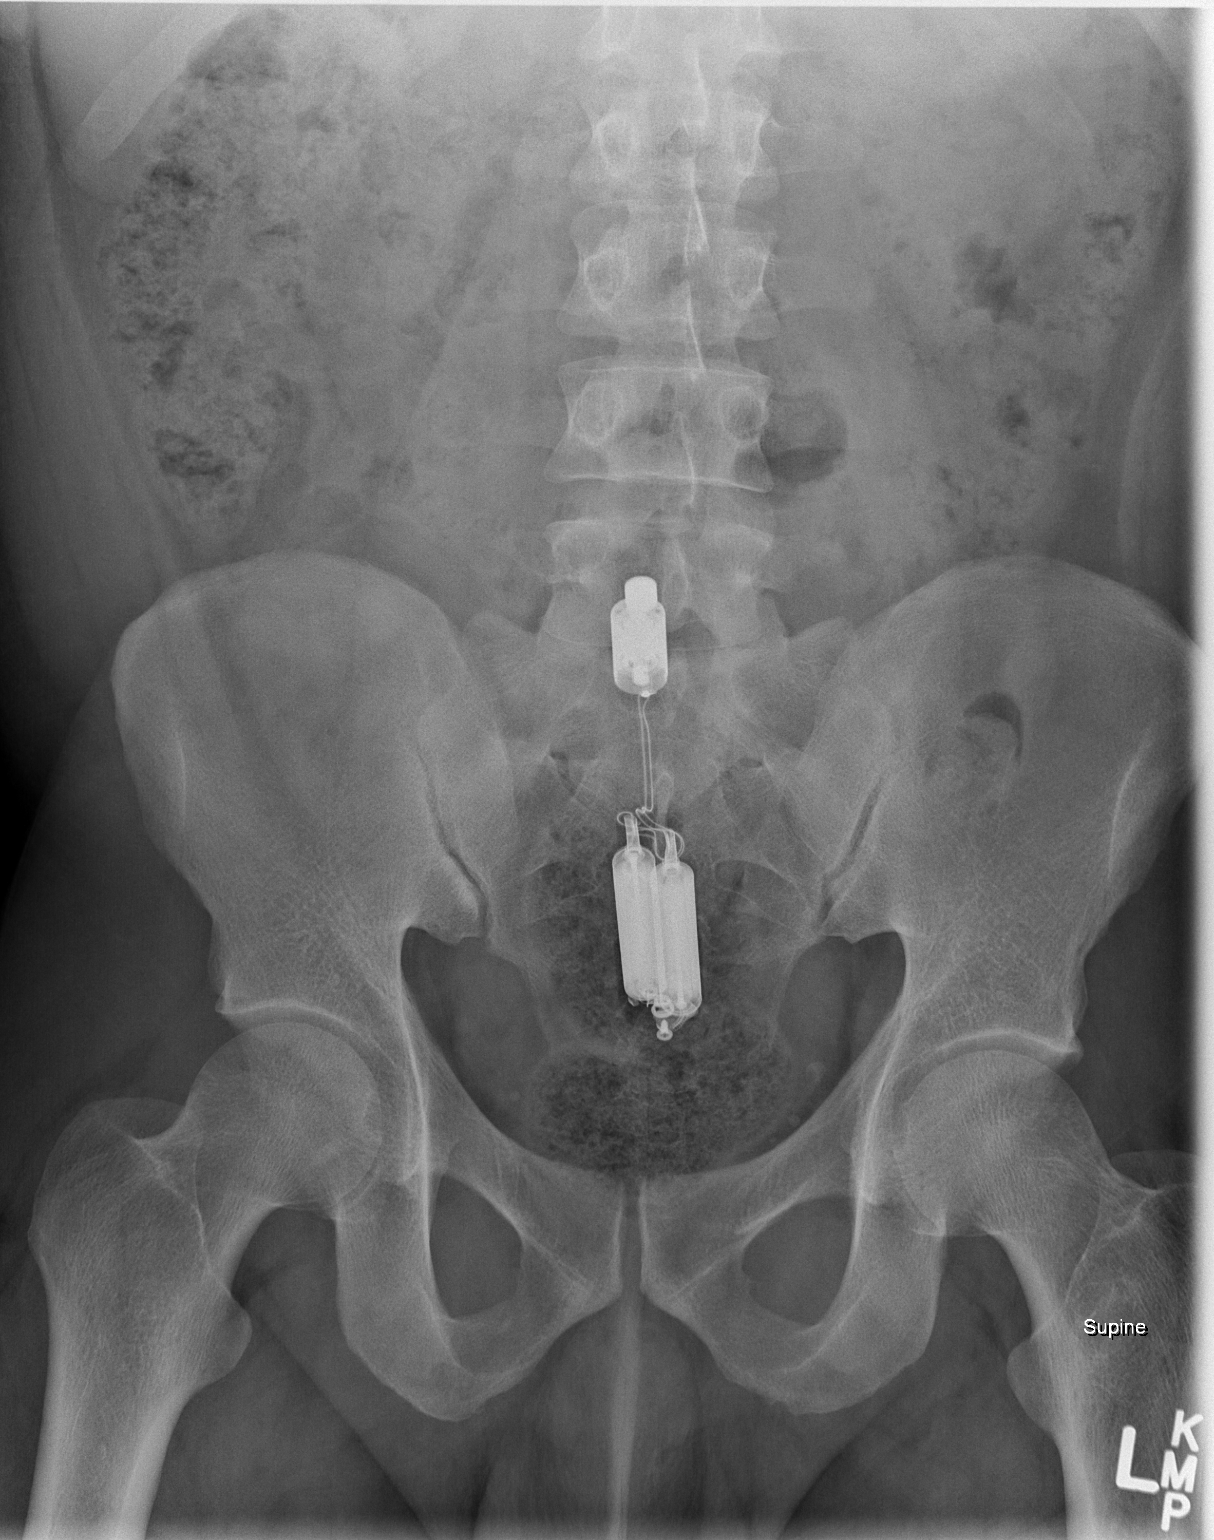

[t abdomen supine (2 of 2)]
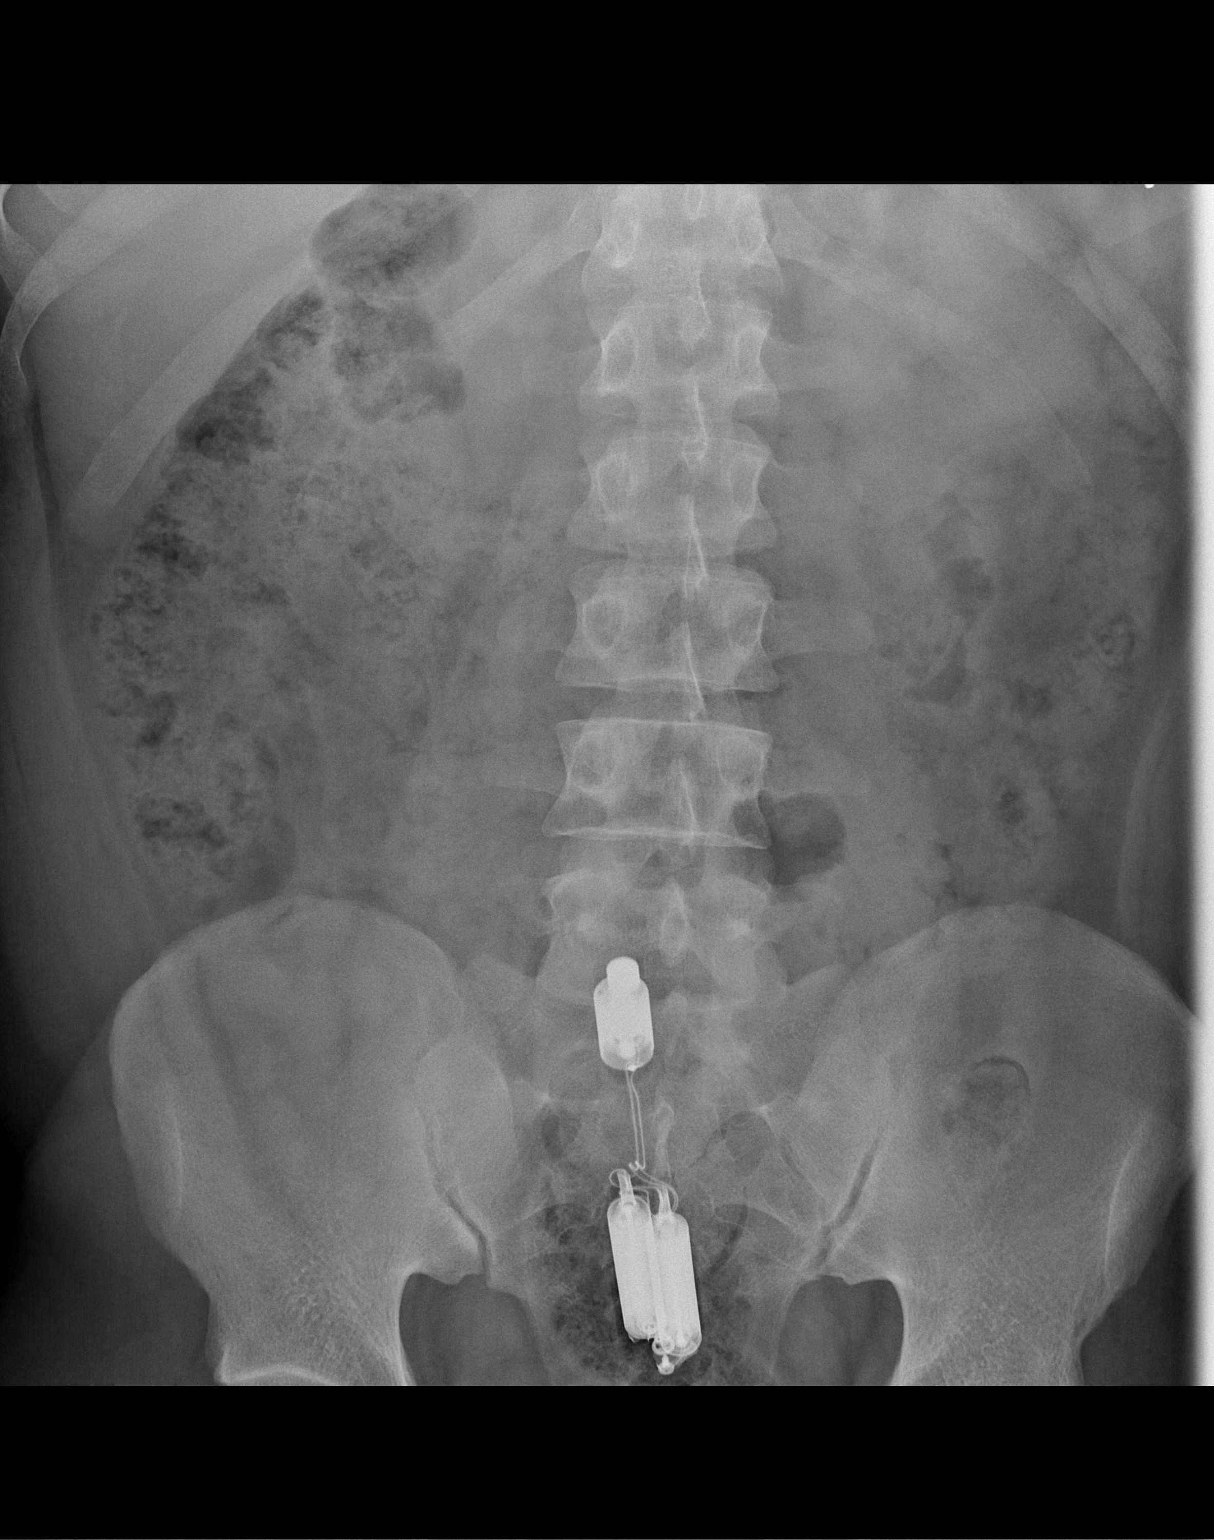

[2 of 2 positions shown; findings below may reference images not displayed]

FINDINGS: Metallic foreign bodies overlie the lower lumbar spine and sacrum,
consistent with a foreign body within the rectosigmoid colon. The
radiopaque components of this measure up to 12.9 cm in length. There
is moderate stool throughout the colon. No bowel wall thickening or
supine evidence of free intraperitoneal air identified. The bones
appear normal.
IMPRESSION: Presacral foreign body consistent with position in the rectosigmoid
colon. If this cannot be retrieved manually or if there is concern
of perforation, further evaluation with CT should be considered.

## 2017-04-11 MED FILL — AZITHROMYCIN 250 MG TABLET: 250 | 5 days supply | Qty: 6 | Fill #0
